# Patient Record
Sex: Female | Born: 1995 | Hispanic: Yes | Marital: Married | Smoking: Never smoker
Health system: Southern US, Community
[De-identification: ages and names within clinical notes are randomized; demographics above are authoritative.]

## PROBLEM LIST (undated history)

## (undated) DIAGNOSIS — Z789 Other specified health status: Secondary | ICD-10-CM

---
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
## 2017-09-21 NOTE — L&D Delivery Note (Addendum)
Delivery Note At 2:36 PM a viable and healthy female was delivered via VBAC, Spontaneous (Presentation: cepahlic; LOA  ).  APGAR: 8, 10;   Placenta status: intact, spontaneous .  Cord: 3 vessel with no complications  Anesthesia:  epidural Episiotomy: None Lacerations:  First degree labial tear Suture Repair: 3.0 vicryl on sh Est. Blood Loss (mL):  150mL  Mom to postpartum.  Baby to Couplet care / Skin to Skin.  Myrene BuddyJacob Christifer Chapdelaine 12/31/2017, 3:04 PM

## 2017-12-20 ENCOUNTER — Encounter: Payer: Self-pay | Admitting: *Deleted

## 2017-12-20 ENCOUNTER — Other Ambulatory Visit: Payer: Self-pay

## 2017-12-20 ENCOUNTER — Encounter (HOSPITAL_COMMUNITY): Payer: Self-pay | Admitting: *Deleted

## 2017-12-20 ENCOUNTER — Inpatient Hospital Stay (HOSPITAL_COMMUNITY)
Admission: AD | Admit: 2017-12-20 | Discharge: 2017-12-20 | Disposition: A | Discharge status: Home / Self Care | Payer: Medicaid Other | Source: Ambulatory Visit | Attending: Obstetrics and Gynecology | Admitting: Obstetrics and Gynecology

## 2017-12-20 DIAGNOSIS — O26893 Other specified pregnancy related conditions, third trimester: Secondary | ICD-10-CM | POA: Diagnosis not present

## 2017-12-20 DIAGNOSIS — R102 Pelvic and perineal pain: Secondary | ICD-10-CM

## 2017-12-20 DIAGNOSIS — Z3A37 37 weeks gestation of pregnancy: Secondary | ICD-10-CM | POA: Insufficient documentation

## 2017-12-20 DIAGNOSIS — O34219 Maternal care for unspecified type scar from previous cesarean delivery: Secondary | ICD-10-CM | POA: Diagnosis present

## 2017-12-20 HISTORY — DX: Other specified health status: Z78.9

## 2017-12-20 LAB — DIFFERENTIAL
BASOS ABS: 0 10*3/uL (ref 0.0–0.1)
Basophils Relative: 0 %
EOS ABS: 0.1 10*3/uL (ref 0.0–0.7)
EOS PCT: 1 %
LYMPHS ABS: 1.6 10*3/uL (ref 0.7–4.0)
Lymphocytes Relative: 25 %
MONOS PCT: 4 %
Monocytes Absolute: 0.3 10*3/uL (ref 0.1–1.0)
Neutro Abs: 4.6 10*3/uL (ref 1.7–7.7)
Neutrophils Relative %: 70 %

## 2017-12-20 LAB — CBC
HEMATOCRIT: 29.5 % — AB (ref 36.0–46.0)
HEMOGLOBIN: 9.7 g/dL — AB (ref 12.0–15.0)
MCH: 28.4 pg (ref 26.0–34.0)
MCHC: 32.9 g/dL (ref 30.0–36.0)
MCV: 86.3 fL (ref 78.0–100.0)
Platelets: 168 10*3/uL (ref 150–400)
RBC: 3.42 MIL/uL — ABNORMAL LOW (ref 3.87–5.11)
RDW: 13.6 % (ref 11.5–15.5)
WBC: 6.6 10*3/uL (ref 4.0–10.5)

## 2017-12-20 LAB — URINALYSIS, ROUTINE W REFLEX MICROSCOPIC
BILIRUBIN URINE: NEGATIVE
GLUCOSE, UA: NEGATIVE mg/dL
HGB URINE DIPSTICK: NEGATIVE
KETONES UR: NEGATIVE mg/dL
LEUKOCYTES UA: NEGATIVE
Nitrite: NEGATIVE
PROTEIN: NEGATIVE mg/dL
Specific Gravity, Urine: 1.018 (ref 1.005–1.030)
pH: 6 (ref 5.0–8.0)

## 2017-12-20 LAB — TYPE AND SCREEN
ABO/RH(D): O POS
Antibody Screen: NEGATIVE

## 2017-12-20 LAB — OB RESULTS CONSOLE GBS: GBS: NEGATIVE

## 2017-12-20 LAB — WET PREP, GENITAL
Clue Cells Wet Prep HPF POC: NONE SEEN
Sperm: NONE SEEN
Trich, Wet Prep: NONE SEEN
Yeast Wet Prep HPF POC: NONE SEEN

## 2017-12-20 LAB — HEMOGLOBIN A1C
Hgb A1c MFr Bld: 5.1 % (ref 4.8–5.6)
MEAN PLASMA GLUCOSE: 99.67 mg/dL

## 2017-12-20 LAB — HEPATITIS B SURFACE ANTIGEN: Hepatitis B Surface Ag: NEGATIVE

## 2017-12-20 LAB — ABO/RH: ABO/RH(D): O POS

## 2017-12-20 NOTE — MAU Provider Note (Signed)
Chief Complaint:  Back Pain and Pelvic Pain   First Provider Initiated Contact with Patient 12/20/17 1352      HPI: Joanne Hooper is a 22 y.o. G2P1001 at [redacted]w[redacted]d who presents to maternity admissions reporting some intermittent mild pelvic pain today. The pain is intermittent, mild, and unchanged since onset.  She has not tried any treatments. The pelvic pain is associated with a slight increase in thin vaginal discharge without odor. She does not have prenatal care in the [place] yet but wants to establish care. She started care in [place] and has [place] but no other records. She has hx of C/S x 1, emergency C/S for fetal distress at 8 cm dilation. She has vertical skin incision but is unsure of the incision on her uterus.  She desires TOLAC with this pregnancy. Her pregnancy has been uncomplicated. In her prior pregnancy, she had skin lesions on the perineum that her regular doctor diagnosed as skin tags but an ED physician was concerned that they were HPV. She reports they were raised skin colored smooth round lesions that got larger during the pregnancy. They were not painful to touch but sometimes hurt when walking and rubbing together. All STD testing was negative. She still has marks from those previous lesions but they did increase in size during this pregnancy. There are no other associated symptoms. She reports good fetal movement, denies LOF, vaginal bleeding, vaginal itching/burning, urinary symptoms, h/a, dizziness, n/v, or fever/chills.    HPI  Past Medical History: Past Medical History:  Diagnosis Date  . Medical history non-contributory     Past obstetric history: OB History  Gravida Para Term Preterm AB Living  2 1 1     1   SAB TAB Ectopic Multiple Live Births               # Outcome Date GA Lbr Len/2nd Weight Sex Delivery Anes PTL Lv  2 Current           1 Term             Past Surgical History: Past Surgical History:  Procedure Laterality Date  . CESAREAN SECTION       Family History: No family history on file.  Social History: Social History   Tobacco Use  . Smoking status: Never Smoker  . Smokeless tobacco: Never Used  Substance Use Topics  . Alcohol use: Never    Frequency: Never  . Drug use: Never    Allergies:  Allergies  Allergen Reactions  . Tuna [Fish Allergy] Swelling    Swollen skin    Meds:  No medications prior to admission.    ROS:  Review of Systems  Constitutional: Negative for chills, fatigue and fever.  Eyes: Negative for visual disturbance.  Respiratory: Negative for shortness of breath.   Cardiovascular: Negative for chest pain.  Gastrointestinal: Negative for abdominal pain, nausea and vomiting.  Genitourinary: Positive for pelvic pain. Negative for difficulty urinating, dysuria, flank pain, vaginal bleeding, vaginal discharge and vaginal pain.  Neurological: Negative for dizziness and headaches.  Psychiatric/Behavioral: Negative.      I have reviewed patient's Past Medical Hx, Surgical Hx, Family Hx, Social Hx, medications and allergies.   Physical Exam   Patient Vitals for the past 24 hrs:  BP Temp Temp src Pulse Resp SpO2 Weight  12/20/17 1513 118/60 - - 88 18 - -  12/20/17 1153 114/68 98 F (36.7 C) Oral 88 17 100 % 172 lb 4 oz (78.1 kg)  Constitutional: Well-developed, well-nourished female in no acute distress.  Cardiovascular: normal rate Respiratory: normal effort GI: Abd soft, non-tender, gravid appropriate for gestational age.  MS: Extremities nontender, no edema, normal ROM Neurologic: Alert and oriented x 4.  GU: Neg CVAT.  PELVIC EXAM: Wet prep, GC, and GBS collected today.  On visual inspection, 5-6 small loose areas of skin, flesh colored, nonpainful to palpation in inner thigh/groin/vulvar areas.    Dilation: Closed Effacement (%): Thick Cervical Position: Posterior Station: -3 Exam by:: Dorrene German RN  FHT:  Baseline 145 , moderate variability, accelerations present, no  decelerations Contractions: rare, mild to palpation   Labs: Results for orders placed or performed during the hospital encounter of 12/20/17 (from the past 24 hour(s))  Urinalysis, Routine w reflex microscopic     Status: Abnormal   Collection Time: 12/20/17 11:36 AM  Result Value Ref Range   Color, Urine YELLOW YELLOW   APPearance HAZY (A) CLEAR   Specific Gravity, Urine 1.018 1.005 - 1.030   pH 6.0 5.0 - 8.0   Glucose, UA NEGATIVE NEGATIVE mg/dL   Hgb urine dipstick NEGATIVE NEGATIVE   Bilirubin Urine NEGATIVE NEGATIVE   Ketones, ur NEGATIVE NEGATIVE mg/dL   Protein, ur NEGATIVE NEGATIVE mg/dL   Nitrite NEGATIVE NEGATIVE   Leukocytes, UA NEGATIVE NEGATIVE  CBC     Status: Abnormal   Collection Time: 12/20/17  2:01 PM  Result Value Ref Range   WBC 6.6 4.0 - 10.5 K/uL   RBC 3.42 (L) 3.87 - 5.11 MIL/uL   Hemoglobin 9.7 (L) 12.0 - 15.0 g/dL   HCT 16.1 (L) 09.6 - 04.5 %   MCV 86.3 78.0 - 100.0 fL   MCH 28.4 26.0 - 34.0 pg   MCHC 32.9 30.0 - 36.0 g/dL   RDW 40.9 81.1 - 91.4 %   Platelets 168 150 - 400 K/uL  Differential     Status: None   Collection Time: 12/20/17  2:01 PM  Result Value Ref Range   Neutrophils Relative % 70 %   Neutro Abs 4.6 1.7 - 7.7 K/uL   Lymphocytes Relative 25 %   Lymphs Abs 1.6 0.7 - 4.0 K/uL   Monocytes Relative 4 %   Monocytes Absolute 0.3 0.1 - 1.0 K/uL   Eosinophils Relative 1 %   Eosinophils Absolute 0.1 0.0 - 0.7 K/uL   Basophils Relative 0 %   Basophils Absolute 0.0 0.0 - 0.1 K/uL  Type and screen     Status: None   Collection Time: 12/20/17  2:01 PM  Result Value Ref Range   ABO/RH(D) O POS    Antibody Screen NEG    Sample Expiration      12/23/2017 Performed at [facility], [address], [place], [place] [address]   Hemoglobin A1c     Status: None   Collection Time: 12/20/17  2:01 PM  Result Value Ref Range   Hgb A1c MFr Bld 5.1 4.8 - 5.6 %   Mean Plasma Glucose 99.67 mg/dL  Wet prep, genital     Status: Abnormal    Collection Time: 12/20/17  2:29 PM  Result Value Ref Range   Yeast Wet Prep HPF POC NONE SEEN NONE SEEN   Trich, Wet Prep NONE SEEN NONE SEEN   Clue Cells Wet Prep HPF POC NONE SEEN NONE SEEN   WBC, Wet Prep HPF POC FEW (A) NONE SEEN   Sperm NONE SEEN    --/--/O POS (04/01 1401)  Imaging:  No results found.  MAU Course/MDM: OB panel, A1C, wet prep, GBS, GCC collected NST reviewed and reactive [facility] Consult Dr Debroah LoopArnold with presentation, exam findings and test results, specifically with previous surgical scar Likely low transverse uterine incision but pt will try to obtain records from [place] VBAC consent signed today, taken to office by hand to scan into chart Rest/heat/warm bath/Tylenol for pelvic pain Pt discharge with strict labor precautions.   Assessment: 1. Pelvic pain affecting pregnancy in third trimester, antepartum   2. Previous cesarean delivery affecting pregnancy, antepartum     Plan: Discharge home Labor precautions and fetal kick counts  Follow-up Information    [facility]   Specialty:  Obstetrics and Gynecology Why:  The office will call you with an appointment. Return to MAU with signs of labor or emergencies. Contact information: [address] [place] [id number] [phone]         Allergies as of 12/20/2017      Reactions   [place] [fish Allergy] Swelling   Swollen skin      Medication List    You have not been prescribed any medications.     Sharen CounterLisa Leftwich-Kirby Certified Nurse-Midwife 12/20/2017 6:47 PM

## 2017-12-20 NOTE — MAU Note (Signed)
2nd preg, due 4/20.  Yesterday, was having pain in low back and pelvis, increases with movement.  Started cramping last night.

## 2017-12-20 NOTE — Progress Notes (Signed)
Dr. Cyndi LennertHinds informed of pt's arrival, recently arrived from [place] on 3/20, C/O back & pelvic pain today, reactive FHR.  Received PNC in [place] has most recent U/S report with her.  First delivery was [facility]S, [facility]  MD states she will call me back.

## 2017-12-20 NOTE — Discharge Instructions (Signed)

## 2017-12-20 NOTE — MAU Note (Signed)
Pt had C/S with first baby, is interested in TOLAC.

## 2017-12-21 LAB — HIV ANTIBODY (ROUTINE TESTING W REFLEX): HIV SCREEN 4TH GENERATION: NONREACTIVE

## 2017-12-21 LAB — GC/CHLAMYDIA PROBE AMP (~~LOC~~) NOT AT ARMC
Chlamydia: NEGATIVE
NEISSERIA GONORRHEA: NEGATIVE

## 2017-12-21 LAB — RUBELLA SCREEN

## 2017-12-21 LAB — RPR: RPR: NONREACTIVE

## 2017-12-22 LAB — CULTURE, BETA STREP (GROUP B ONLY)

## 2017-12-27 ENCOUNTER — Telehealth: Payer: Self-pay | Admitting: General Practice

## 2017-12-27 NOTE — Telephone Encounter (Signed)
Patient called and left message on nurse line stating she was seen a couple days ago and was told to call us back with certain information. Called patient and she states she is [redacted] weeks pregnant and just moved here from [place]. Patient states she had a previous c-section but wants a VBAC this time. Patient states she was told to get her c-section report so we have that information but she has had a hard time getting that. Patient states she just came by the office and signed a release so we can attempt to get records. Patient is aware of appt tomorrow. Patient had no questions

## 2017-12-28 ENCOUNTER — Ambulatory Visit (INDEPENDENT_AMBULATORY_CARE_PROVIDER_SITE_OTHER): Payer: Medicaid Other | Admitting: Obstetrics and Gynecology

## 2017-12-28 ENCOUNTER — Telehealth: Payer: Self-pay

## 2017-12-28 ENCOUNTER — Encounter: Payer: Self-pay | Admitting: Obstetrics and Gynecology

## 2017-12-28 DIAGNOSIS — Z3483 Encounter for supervision of other normal pregnancy, third trimester: Secondary | ICD-10-CM | POA: Diagnosis not present

## 2017-12-28 DIAGNOSIS — Z23 Encounter for immunization: Secondary | ICD-10-CM | POA: Diagnosis not present

## 2017-12-28 DIAGNOSIS — O34219 Maternal care for unspecified type scar from previous cesarean delivery: Secondary | ICD-10-CM | POA: Diagnosis not present

## 2017-12-28 DIAGNOSIS — Z3480 Encounter for supervision of other normal pregnancy, unspecified trimester: Secondary | ICD-10-CM | POA: Insufficient documentation

## 2017-12-28 NOTE — Progress Notes (Signed)
   PRENATAL VISIT NOTE  Subjective:  Joanne Hooper is a 22 y.o. G2P1001 at 7136w3d being seen today for ongoing prenatal care.  Patient recently moved from [place] without records. She is alone in the country currently without family. She is currently monitored for the following issues for this low-risk pregnancy and has Previous cesarean delivery affecting pregnancy, antepartum and Supervision of other normal pregnancy, antepartum on their problem list.  Patient reports no complaints.  Contractions: Irregular. Vag. Bleeding: None.   . Denies leaking of fluid.   The following portions of the patient's history were reviewed and updated as appropriate: allergies, current medications, past family history, past medical history, past social history, past surgical history and problem list. Problem list updated.  Objective:   Vitals:   12/28/17 1000 12/28/17 1004  BP: 128/72   Pulse: 75   Weight: 173 lb 11.2 oz (78.8 kg)   Height:  5\' 1"  (1.549 m)    Fetal Status: Fetal Heart Rate (bpm): 130 Fundal Height: 39 cm    Presentation: Vertex  General:  Alert, oriented and cooperative. Patient is in no acute distress.  Skin: Skin is warm and dry. No rash noted.   Cardiovascular: Normal heart rate noted  Respiratory: Normal respiratory effort, no problems with respiration noted  Abdomen: Soft, gravid, appropriate for gestational age.  Pain/Pressure: Present     Pelvic: Cervical exam deferred Dilation: 1.5 Effacement (%): 50 Station: -3  Extremities: Normal range of motion.  Edema: Trace  Mental Status: Normal mood and affect. Normal behavior. Normal judgment and thought content.   Assessment and Plan:  Pregnancy: G2P1001 at 5336w3d  1. Supervision of other normal pregnancy, antepartum Patient is doing well without complaints Ultrasound ordered with health department  2. Previous cesarean delivery affecting pregnancy, antepartum Patient desires TOLAC Consent signed 12/20/2017  Term labor  symptoms and general obstetric precautions including but not limited to vaginal bleeding, contractions, leaking of fluid and fetal movement were reviewed in detail with the patient. Please refer to After Visit Summary for other counseling recommendations.  Return in about 1 week (around 01/04/2018) for ROB.  No future appointments.  Catalina AntiguaPeggy Lon Klippel, MD

## 2017-12-28 NOTE — Telephone Encounter (Signed)
Called pt to inform her of her US scheduled at the [facility] on April 11,2019 at 9:30am. Pt verbalized understanding and had no questions.  

## 2017-12-28 NOTE — Addendum Note (Signed)
Addended by: Lorelle GibbsWILSON, CHIQUITA L on: 12/28/2017 11:37 AM   Modules accepted: Orders

## 2017-12-31 ENCOUNTER — Other Ambulatory Visit: Payer: Self-pay

## 2017-12-31 ENCOUNTER — Inpatient Hospital Stay (HOSPITAL_COMMUNITY): Payer: Medicaid Other | Admitting: Anesthesiology

## 2017-12-31 ENCOUNTER — Encounter (HOSPITAL_COMMUNITY): Payer: Self-pay

## 2017-12-31 ENCOUNTER — Inpatient Hospital Stay (HOSPITAL_COMMUNITY)
Admission: AD | Admit: 2017-12-31 | Discharge: 2018-01-02 | DRG: 807 | Disposition: A | Discharge status: Home / Self Care | Payer: Medicaid Other | Source: Ambulatory Visit | Attending: Obstetrics & Gynecology | Admitting: Obstetrics & Gynecology

## 2017-12-31 DIAGNOSIS — Z3A39 39 weeks gestation of pregnancy: Secondary | ICD-10-CM | POA: Diagnosis not present

## 2017-12-31 DIAGNOSIS — Z3480 Encounter for supervision of other normal pregnancy, unspecified trimester: Secondary | ICD-10-CM

## 2017-12-31 DIAGNOSIS — O34219 Maternal care for unspecified type scar from previous cesarean delivery: Principal | ICD-10-CM | POA: Diagnosis present

## 2017-12-31 DIAGNOSIS — Z3483 Encounter for supervision of other normal pregnancy, third trimester: Secondary | ICD-10-CM | POA: Diagnosis present

## 2017-12-31 DIAGNOSIS — Z3A38 38 weeks gestation of pregnancy: Secondary | ICD-10-CM

## 2017-12-31 LAB — CBC
HCT: 31.2 % — ABNORMAL LOW (ref 36.0–46.0)
Hemoglobin: 10.1 g/dL — ABNORMAL LOW (ref 12.0–15.0)
MCH: 27.4 pg (ref 26.0–34.0)
MCHC: 32.4 g/dL (ref 30.0–36.0)
MCV: 84.6 fL (ref 78.0–100.0)
Platelets: 192 K/uL (ref 150–400)
RBC: 3.69 MIL/uL — ABNORMAL LOW (ref 3.87–5.11)
RDW: 13.8 % (ref 11.5–15.5)
WBC: 10.6 K/uL — ABNORMAL HIGH (ref 4.0–10.5)

## 2017-12-31 LAB — TYPE AND SCREEN
ABO/RH(D): O POS
Antibody Screen: NEGATIVE

## 2017-12-31 LAB — SYPHILIS: RPR W/REFLEX TO RPR TITER AND TREPONEMAL ANTIBODIES, TRADITIONAL SCREENING AND DIAGNOSIS ALGORITHM: RPR Ser Ql: NONREACTIVE

## 2017-12-31 MED ORDER — WITCH HAZEL-GLYCERIN EX PADS: 1.0000 "application " | MEDICATED_PAD | CUTANEOUS | Status: DC | PRN

## 2017-12-31 MED ORDER — FLEET ENEMA 7-19 GM/118ML RE ENEM: 1.0000 | ENEMA | RECTAL | Status: DC | PRN

## 2017-12-31 MED ORDER — OXYCODONE-ACETAMINOPHEN 5-325 MG PO TABS
1.0000 | ORAL_TABLET | ORAL | Status: DC | PRN
Start: 2017-12-31 — End: 2017-12-31

## 2017-12-31 MED ORDER — SIMETHICONE 80 MG PO CHEW
80.0000 mg | CHEWABLE_TABLET | ORAL | Status: DC | PRN
  Administered 2018-01-01 (×2): 80 mg via ORAL
  Filled 2017-12-31 (×2): qty 1

## 2017-12-31 MED ORDER — PRENATAL MULTIVITAMIN CH
1.0000 | ORAL_TABLET | Freq: Every day | ORAL | Status: DC
  Administered 2018-01-01 – 2018-01-02 (×2): 1 via ORAL
  Filled 2017-12-31 (×2): qty 1

## 2017-12-31 MED ORDER — TERBUTALINE SULFATE 1 MG/ML IJ SOLN
0.2500 mg | Freq: Once | INTRAMUSCULAR | Status: DC | PRN
  Filled 2017-12-31: qty 1

## 2017-12-31 MED ORDER — EPHEDRINE 5 MG/ML INJ
10.0000 mg | INTRAVENOUS | Status: DC | PRN
  Filled 2017-12-31: qty 2

## 2017-12-31 MED ORDER — LACTATED RINGERS IV SOLN: 500.0000 mL | Freq: Once | INTRAVENOUS | Status: DC

## 2017-12-31 MED ORDER — OXYTOCIN 40 UNITS IN LACTATED RINGERS INFUSION - SIMPLE MED
2.5000 [IU]/h | INTRAVENOUS | Status: DC
  Filled 2017-12-31: qty 1000

## 2017-12-31 MED ORDER — SENNOSIDES-DOCUSATE SODIUM 8.6-50 MG PO TABS
2.0000 | ORAL_TABLET | ORAL | Status: DC
  Administered 2018-01-01 (×2): 2 via ORAL
  Filled 2017-12-31 (×2): qty 2

## 2017-12-31 MED ORDER — DIBUCAINE 1 % RE OINT: 1.0000 "application " | TOPICAL_OINTMENT | RECTAL | Status: DC | PRN

## 2017-12-31 MED ORDER — OXYCODONE-ACETAMINOPHEN 5-325 MG PO TABS: 2.0000 | ORAL_TABLET | ORAL | Status: DC | PRN

## 2017-12-31 MED ORDER — IBUPROFEN 600 MG PO TABS
600.0000 mg | ORAL_TABLET | Freq: Four times a day (QID) | ORAL | Status: DC
  Administered 2017-12-31 – 2018-01-02 (×7): 600 mg via ORAL
  Filled 2017-12-31 (×8): qty 1

## 2017-12-31 MED ORDER — MENTHOL 3 MG MT LOZG
1.0000 | LOZENGE | OROMUCOSAL | Status: DC | PRN
  Filled 2017-12-31: qty 9

## 2017-12-31 MED ORDER — BUTORPHANOL TARTRATE 1 MG/ML IJ SOLN
1.0000 mg | Freq: Once | INTRAMUSCULAR | Status: DC
Start: 2017-12-31 — End: 2017-12-31

## 2017-12-31 MED ORDER — LACTATED RINGERS IV SOLN
INTRAVENOUS | Status: DC
  Administered 2017-12-31 (×2): via INTRAVENOUS

## 2017-12-31 MED ORDER — LACTATED RINGERS IV SOLN: 500.0000 mL | INTRAVENOUS | Status: DC | PRN

## 2017-12-31 MED ORDER — PHENYLEPHRINE 40 MCG/ML (10ML) SYRINGE FOR IV PUSH (FOR BLOOD PRESSURE SUPPORT)
PREFILLED_SYRINGE | INTRAVENOUS | Status: AC
  Filled 2017-12-31: qty 10

## 2017-12-31 MED ORDER — SOD CITRATE-CITRIC ACID 500-334 MG/5ML PO SOLN: 30.0000 mL | ORAL | Status: DC | PRN

## 2017-12-31 MED ORDER — ACETAMINOPHEN 325 MG PO TABS: 650.0000 mg | ORAL_TABLET | ORAL | Status: DC | PRN

## 2017-12-31 MED ORDER — ACETAMINOPHEN 325 MG PO TABS
650.0000 mg | ORAL_TABLET | ORAL | Status: DC | PRN
  Administered 2017-12-31 – 2018-01-02 (×7): 650 mg via ORAL
  Filled 2017-12-31 (×7): qty 2

## 2017-12-31 MED ORDER — DIPHENHYDRAMINE HCL 25 MG PO CAPS: 25.0000 mg | ORAL_CAPSULE | Freq: Four times a day (QID) | ORAL | Status: DC | PRN

## 2017-12-31 MED ORDER — FENTANYL 2.5 MCG/ML BUPIVACAINE 1/10 % EPIDURAL INFUSION (WH - ANES)
14.0000 mL/h | INTRAMUSCULAR | Status: DC | PRN
  Administered 2017-12-31: 14 mL/h via EPIDURAL

## 2017-12-31 MED ORDER — OXYTOCIN 40 UNITS IN LACTATED RINGERS INFUSION - SIMPLE MED
1.0000 m[IU]/min | INTRAVENOUS | Status: DC
  Administered 2017-12-31: 2 m[IU]/min via INTRAVENOUS

## 2017-12-31 MED ORDER — PHENYLEPHRINE 40 MCG/ML (10ML) SYRINGE FOR IV PUSH (FOR BLOOD PRESSURE SUPPORT)
80.0000 ug | PREFILLED_SYRINGE | INTRAVENOUS | Status: DC | PRN
  Filled 2017-12-31: qty 5

## 2017-12-31 MED ORDER — ONDANSETRON HCL 4 MG PO TABS: 4.0000 mg | ORAL_TABLET | ORAL | Status: DC | PRN

## 2017-12-31 MED ORDER — ONDANSETRON HCL 4 MG/2ML IJ SOLN: 4.0000 mg | INTRAMUSCULAR | Status: DC | PRN

## 2017-12-31 MED ORDER — DIPHENHYDRAMINE HCL 50 MG/ML IJ SOLN: 12.5000 mg | INTRAMUSCULAR | Status: DC | PRN

## 2017-12-31 MED ORDER — COCONUT OIL OIL: 1.0000 "application " | TOPICAL_OIL | Status: DC | PRN

## 2017-12-31 MED ORDER — FENTANYL 2.5 MCG/ML BUPIVACAINE 1/10 % EPIDURAL INFUSION (WH - ANES)
INTRAMUSCULAR | Status: AC
  Filled 2017-12-31: qty 100

## 2017-12-31 MED ORDER — LIDOCAINE HCL (PF) 1 % IJ SOLN
INTRAMUSCULAR | Status: DC | PRN
  Administered 2017-12-31: 13 mL via EPIDURAL

## 2017-12-31 MED ORDER — LIDOCAINE HCL (PF) 1 % IJ SOLN
30.0000 mL | INTRAMUSCULAR | Status: AC | PRN
  Administered 2017-12-31: 30 mL via SUBCUTANEOUS
  Filled 2017-12-31: qty 30

## 2017-12-31 MED ORDER — ZOLPIDEM TARTRATE 5 MG PO TABS: 5.0000 mg | ORAL_TABLET | Freq: Every evening | ORAL | Status: DC | PRN

## 2017-12-31 MED ORDER — BENZOCAINE-MENTHOL 20-0.5 % EX AERO
1.0000 "application " | INHALATION_SPRAY | CUTANEOUS | Status: DC | PRN
  Administered 2017-12-31 – 2018-01-02 (×2): 1 via TOPICAL
  Filled 2017-12-31 (×3): qty 56

## 2017-12-31 MED ORDER — MEASLES, MUMPS & RUBELLA VAC ~~LOC~~ INJ
0.5000 mL | INJECTION | Freq: Once | SUBCUTANEOUS | Status: AC
  Administered 2018-01-02: 0.5 mL via SUBCUTANEOUS
  Filled 2017-12-31: qty 0.5

## 2017-12-31 MED ORDER — FENTANYL CITRATE (PF) 100 MCG/2ML IJ SOLN: 50.0000 ug | INTRAMUSCULAR | Status: DC | PRN

## 2017-12-31 MED ORDER — TETANUS-DIPHTH-ACELL PERTUSSIS 5-2.5-18.5 LF-MCG/0.5 IM SUSP: 0.5000 mL | Freq: Once | INTRAMUSCULAR | Status: DC

## 2017-12-31 MED ORDER — OXYTOCIN BOLUS FROM INFUSION
500.0000 mL | Freq: Once | INTRAVENOUS | Status: AC
  Administered 2017-12-31: 500 mL via INTRAVENOUS

## 2017-12-31 MED ORDER — ONDANSETRON HCL 4 MG/2ML IJ SOLN: 4.0000 mg | Freq: Four times a day (QID) | INTRAMUSCULAR | Status: DC | PRN

## 2017-12-31 NOTE — MAU Note (Signed)
cntx since 2300 last night. Denies LOF or bleeding. +FM

## 2017-12-31 NOTE — Anesthesia Pain Management Evaluation Note (Signed)
  CRNA Pain Management Visit Note  Patient: Joanne Hooper, 22 y.o., female  "Hello I am a member of the anesthesia team at [facility]. We have an anesthesia team available at all times to provide care throughout the hospital, including epidural management and anesthesia for C-section. I don't know your plan for the delivery whether it a natural birth, water birth, IV sedation, nitrous supplementation, doula or epidural, but we want to meet your pain goals."   1.Was your pain managed to your expectations on prior hospitalizations?   Yes   2.What is your expectation for pain management during this hospitalization?     Epidural  3.How can we help you reach that goal? Epidural  Record the patient's initial score and the patient's pain goal.   Pain: 10 - pressure-"feel like needs to go to the bathroom" Patient is 9 cm.  Pain Goal: unable to assess. [facility] wants you to be able to say your pain was always managed very well.  Wilmont Olund 12/31/2017

## 2017-12-31 NOTE — Progress Notes (Signed)
Subjective: Doing well. Pain controlled. Had ROM prior to entering room.   Objective: BP 114/76   Pulse 100   Temp 98.3 F (36.8 C) (Oral)   Resp 18   Ht 5\' 1"  (1.549 m)   Wt 79.9 kg (176 lb 1.3 oz)   SpO2 99%   BMI 33.27 kg/m  No intake/output data recorded. No intake/output data recorded.  FHT:  FHR: 140 bpm, variability: minimal ,  accelerations:  Abscent,  decelerations:  Present late decel x1 UC:   regular, every 4 minutes SVE:   Dilation: 7 Effacement (%): 90 Station: -1 Exam by:: k fields, rn  Labs: Lab Results  Component Value Date   WBC 10.6 (H) 12/31/2017   HGB 10.1 (L) 12/31/2017   HCT 31.2 (L) 12/31/2017   MCV 84.6 12/31/2017   PLT 192 12/31/2017    Assessment / Plan: TOLAC progressing well. Just had ROM, so will hold off on augmentation for now.  Labor: Progressing normally Preeclampsia:  N/A Fetal Wellbeing:  Category II Pain Control:  Epidural I/D:  n/a Anticipated MOD:  NSVD  Myrene BuddyJacob Ciani Rutten 12/31/2017, 12:24 PM

## 2017-12-31 NOTE — Anesthesia Postprocedure Evaluation (Signed)
Anesthesia Post Note  Patient: Joanne Hooper  Procedure(s) Performed: AN AD HOC LABOR EPIDURAL     Patient location during evaluation: Mother Baby Anesthesia Type: Epidural Level of consciousness: awake and alert and oriented Pain management: satisfactory to patient Vital Signs Assessment: post-procedure vital signs reviewed and stable Respiratory status: respiratory function stable Cardiovascular status: stable Postop Assessment: no headache, no backache, epidural receding, patient able to bend at knees, no signs of nausea or vomiting and adequate PO intake Anesthetic complications: no    Last Vitals:  Vitals:   12/31/17 1531 12/31/17 1546  BP: 119/60 112/62  Pulse: 93 88  Resp: 18 18  Temp:    SpO2:      Last Pain:  Vitals:   12/31/17 1727  TempSrc:   PainSc: 8    Pain Goal:                 Joanne Hooper

## 2017-12-31 NOTE — Anesthesia Procedure Notes (Signed)
Epidural Patient location during procedure: OB Start time: 12/31/2017 8:28 AM End time: 12/31/2017 8:48 AM  Staffing Anesthesiologist: Lowella CurbMiller, Vayden Weinand Ray, MD Performed: anesthesiologist   Preanesthetic Checklist Completed: patient identified, site marked, surgical consent, pre-op evaluation, timeout performed, IV checked, risks and benefits discussed and monitors and equipment checked  Epidural Patient position: sitting Prep: ChloraPrep Patient monitoring: heart rate, cardiac monitor, continuous pulse ox and blood pressure Approach: midline Location: L1-L2 Injection technique: LOR saline  Needle:  Needle type: Tuohy  Needle gauge: 17 G Needle length: 9 cm Needle insertion depth: 6 cm Catheter type: closed end flexible Catheter size: 20 Guage Catheter at skin depth: 10 cm Test dose: negative  Assessment Events: blood not aspirated, injection not painful, no injection resistance, negative IV test and no paresthesia  Additional Notes Reason for block:procedure for pain

## 2017-12-31 NOTE — H&P (Addendum)
OBSTETRIC ADMISSION HISTORY AND PHYSICAL  Joanne Hooper is a 22 y.o. female G2P1001 with IUP at 2w6dby lmp presenting for active labor. She reports +FMs, No LOF, no VB, no blurry vision, headaches or peripheral edema, and RUQ pain. [place] her report there has been nothing concerning with her pregnancy. Her blood pressures have been fine, and her blood glucose was well controlled. She had an ultrasound performed on 4/11 which was normal.  [place] She states that they told her the reason was for fetal distress. While she has a vertical skin incision the uterine incision is unknown. She also said that there was a lot of concern about skin tags that she had. She had gotten to 8 dilation and that is when they decided to do the C-Section. She has never been told that she cannot labor again. VBAC consent signed on 4/1- on chart.  She plans on breast feeding. She request IUD for birth control. [facility]   Nursing Staff Provider  Office Location   Dating    Language  English  Anatomy [place]   Flu Vaccine  12/28/17 Genetic Screen  NIPS:   AFP:   First Screen:  Quad:    TDaP vaccine  12/28/17 Hgb A1C or  GTT Early  Third trimester A1c 5.1  Rhogam     LAB RESULTS   Feeding Plan Breast  Blood Type --/--/O POS, OJenetta DownerPOS [facility] [address] [place] [address] [phone]/01 1401)   Contraception IUD  Antibody NEG (04/01 1401)  Circumcision Girl  Rubella <0.90 (04/01 1401)  Pediatrician   RPR Non Reactive (04/01 1401)   Support Person Friend  HBsAg Negative (04/01 1401)   Prenatal Classes  HIV Non Reactive (04/01 1401)  BTL Consent  GBS   negative(For PCN allergy, check sensitivities)   VBAC Consent  12/20/2017 Pap     Hgb Electro      CF     SMA     Waterbirth  [ ]  Class [ ]  Consent [ ]  CNM visit    Dating: By Lmp --->  Estimated Date of  Delivery: 01/08/18  Sono:    @[redacted]w[redacted]d , CWD, normal anatomy, cephalic presentation   Prenatal History/Complications:  Past Medical History: Past Medical History:  Diagnosis Date  . Medical history non-contributory     Past Surgical History: Past Surgical History:  Procedure Laterality Date  . CESAREAN SECTION      Obstetrical History: OB History    Gravida  2   Para  1   Term  1   Preterm      AB      Living  1     SAB      TAB      Ectopic      Multiple      Live Births  1           Social History: Social History   Socioeconomic History  . Marital status: Married    Spouse name: Not on file  . Number of children: Not on file  . Years of education: Not on file  . Highest education level: Not on file  Occupational History  . Not on file  Social Needs  . Financial resource strain: Not on file  . Food insecurity:    Worry: Not on file  Inability: Not on file  . Transportation needs:    Medical: Not on file    Non-medical: Not on file  Tobacco Use  . Smoking status: Never Smoker  . Smokeless tobacco: Never Used  Substance and Sexual Activity  . Alcohol use: Never    Frequency: Never  . Drug use: Never  . Sexual activity: Not Currently    Birth control/protection: None  Lifestyle  . Physical activity:    Days per week: Not on file    Minutes per session: Not on file  . Stress: Not on file  Relationships  . Social connections:    Talks on phone: Not on file    Gets together: Not on file    Attends religious service: Not on file    Active member of club or organization: Not on file    Attends meetings of clubs or organizations: Not on file    Relationship status: Not on file  Other Topics Concern  . Not on file  Social History Narrative  . Not on file    Family History: Family History  Problem Relation Age of Onset  . Diabetes Mother     Allergies: Allergies  Allergen Reactions  . Tuna [Fish Allergy] Swelling     Swollen skin    No medications prior to admission.     Review of Systems   All systems reviewed and negative except as stated in HPI  Blood pressure 119/67, pulse 91, temperature 98.4 F (36.9 C), temperature source Oral, resp. rate 16, height 5' 1"  (1.549 m), weight 79.9 kg (176 lb 1.3 oz), SpO2 99 %. General appearance: alert and cooperative Lungs: clear to auscultation bilaterally Heart: regular rate and rhythm Abdomen: soft, non-tender; bowel sounds normal Extremities: Homans sign is negative, no sign of DVT DTR's normal Presentation: cephalic Fetal monitoringBaseline: 130 bpm, Variability: Good {> 6 bpm), Accelerations: Reactive and Decelerations: Absent Uterine activityFrequency: Every 4 minutes Dilation: 7 Effacement (%): 100 Station: -2 Exam by:: k fields, rn   Prenatal labs: ABO, Rh: --/--/O POS, O POS Performed at [facility], [address] [place], [place] [address]  [phone]/01 1401) Antibody: NEG (04/01 1401) Rubella: <0.90 (04/01 1401) RPR: Non Reactive (04/01 1401)  HBsAg: Negative (04/01 1401)  HIV: Non Reactive (04/01 1401)  GBS: Negative (04/01 0000)  1 hr Glucola normal per patient account Genetic screening  Normal per patient account Anatomy US normal per patient account  Prenatal Transfer Tool  Maternal Diabetes: No- nl A1c Genetic Screening: Declined Maternal Ultrasounds/Referrals: Normal Fetal Ultrasounds or other Referrals:  None Maternal Substance Abuse:  No Significant Maternal Medications:  None Significant Maternal Lab Results: Lab values include: Group B Strep negative  Results for orders placed or performed during the hospital encounter of 12/31/17 (from the past 24 hour(s))  CBC   Collection Time: 12/31/17  7:45 AM  Result Value Ref Range   WBC 10.6 (H) 4.0 - 10.5 K/uL   RBC 3.69 (L) 3.87 - 5.11 MIL/uL   Hemoglobin 10.1 (L) 12.0 - 15.0 g/dL   HCT 31.2 (L) 36.0 - 46.0 %   MCV 84.6 78.0 - 100.0 fL   MCH 27.4 26.0 - 34.0 pg    MCHC 32.4 30.0 - 36.0 g/dL   RDW 13.8 11.5 - 15.5 %   Platelets 192 150 - 400 K/uL    Patient Active Problem List   Diagnosis Date Noted  . Supervision of other normal pregnancy, antepartum 12/28/2017  . Previous cesarean delivery affecting pregnancy, antepartum  12/20/2017    Assessment/Plan:  Joanne Hooper is a 22 y.o. G2P1001 at 35w6dhere for onset of labor. Unfortunately [place] has vertical incision from previous C-Section but it is unknown which incision use on the uterus. To best of patient's knowledge she has not been told she cannot labor. She has a consent for a VBAC in chart. GBS negative. Has received epidural.  #Labor:expectant management, TOLAC, VBAC consent signed #Pain: epidrual #FWB: Cat 1 #ID:  N/A #MOF: breast #MOC:IUD #Circ:  N/A  JGuadalupe Dawn MD  12/31/2017, 9:05 AM   CNM attestation:  I have seen and examined this patient; I agree with above documentation in the resident's note.   Joanne Hooper a 22y.o. G2P1001 here for active labor; desires TOLAC- understands risks and consent is on chart  PE: BP 114/64   Pulse 92   Temp 98.4 F (36.9 C) (Oral)   Resp 18   Ht 5' 1"  (1.549 m)   Wt 79.9 kg (176 lb 1.3 oz)   SpO2 99%   BMI 33.27 kg/m  Gen: calm comfortable, NAD w/ epidural Resp: normal effort, no distress Abd: gravid  ROS, labs, PMH reviewed  [facility]: [facility] Anticipate VB[facility] Offer MMR pp  SSerita GrammesCNM 12/31/2017, 11:36 AM

## 2017-12-31 NOTE — Lactation Note (Signed)
This note was copied from a baby's chart. Lactation Consultation Note  Patient Name: Joanne Hooper WUJWJ'XToday's Date: 12/31/2017 Reason for consult: Initial assessment;Early term 5637-38.6wks  7 hours old early term female who is being exclusively BF by her mother, she's a P2. Mom is experienced BF she was able to BF her first child for 14 months. RN was working with mom when entering the room, per RN baby is too sleepy to feed, she explained to mom to "bring the food to baby if baby is not taking the food from the breast" she suggested to hand express if baby is unable to latch.  LC offered latch assistance but baby would not wake up at first. Mom hand expressed 3 ml of colostrum and LC showed mom how to spoon feed baby. Baby was able to finish all EBM and by the time she was done she was wide awake and ready to go to the breast. Latched baby on left breast on football position and she was able to sustain the latch [facility], baby still nursing when exiting the room.  Encouraged mom to feed STS baby 8-12 times/24 hours or sooner if feeding cues are present. Also, encouraged mom to hand express prior feedings and spoon feed baby if she's still too sleepy to feed. Mom will give baby an opportunity to feed placing her STS at the breast if baby has not cued in 3 hours. Reviewed BF brochure, BF resources and feeding diary, mom is aware of LC services and will call PRN.  Maternal Data Formula Feeding for Exclusion: No Has patient been taught Hand Expression?: Yes Does the patient have breastfeeding experience prior to this delivery?: Yes  Feeding Feeding Type: Breast Fed Length of feed: 8 min(Baby still nursing when exiting the room)  LATCH Score Latch: Grasps breast easily, tongue down, lips flanged, rhythmical sucking.  Audible Swallowing: A few with stimulation  Type of Nipple: Everted at rest and after stimulation  Comfort (Breast/Nipple): Soft /  non-tender  Hold (Positioning): Assistance needed to correctly position infant at breast and maintain latch.  LATCH Score: 8  Interventions Interventions: Breast feeding basics reviewed;Assisted with latch;Skin to skin;Breast massage;Breast compression;Adjust position;Hand express;Support pillows;Position options;Expressed milk  Lactation Tools Discussed/Used Tools: Other (comment)(spoon) WIC Program: No   Consult Status Consult Status: Follow-up Date: 01/01/18 Follow-up type: In-patient    Joanne Hooper 12/31/2017, 9:50 PM

## 2017-12-31 NOTE — Anesthesia Preprocedure Evaluation (Signed)
Anesthesia Evaluation  Patient identified by MRN, date of birth, ID band Patient awake    Reviewed: Allergy & Precautions, NPO status , Patient's Chart, lab work & pertinent test results  Airway Mallampati: II  TM Distance: >3 FB Neck ROM: Full    Dental no notable dental hx.    Pulmonary neg pulmonary ROS,    Pulmonary exam normal breath sounds clear to auscultation       Cardiovascular negative cardio ROS Normal cardiovascular exam Rhythm:Regular Rate:Normal     Neuro/Psych negative neurological ROS  negative psych ROS   GI/Hepatic negative GI ROS, Neg liver ROS,   Endo/Other  negative endocrine ROS  Renal/GU negative Renal ROS  negative genitourinary   Musculoskeletal negative musculoskeletal ROS (+)   Abdominal   Peds negative pediatric ROS (+)  Hematology negative hematology ROS (+)   Anesthesia Other Findings   Reproductive/Obstetrics negative OB ROS (+) Pregnancy                             Anesthesia Physical Anesthesia Plan  ASA: II  Anesthesia Plan: Epidural   Post-op Pain Management:    Induction:   PONV Risk Score and Plan: Treatment may vary due to age or medical condition  Airway Management Planned:   Additional Equipment:   Intra-op Plan:   Post-operative Plan:   Informed Consent: I have reviewed the patients History and Physical, chart, labs and discussed the procedure including the risks, benefits and alternatives for the proposed anesthesia with the patient or authorized representative who has indicated his/her understanding and acceptance.     Plan Discussed with: CRNA  Anesthesia Plan Comments:         Anesthesia Quick Evaluation

## 2017-12-31 NOTE — Progress Notes (Signed)
Called Dr Abelardo DieselMcMullen w/repeat SVE at 0700. Exam unchanged but pt very painful.  Requested pain meds and provider eval of patient.

## 2018-01-01 NOTE — Progress Notes (Signed)
MD notified of pts elevated temps and history of fever with last delivery. Encouraged Pt to drink plenty of fluids. Order received for sore throat lozengers

## 2018-01-01 NOTE — Progress Notes (Addendum)
POSTPARTUM PROGRESS NOTE  Post Partum Day #1 Subjective:  Joanne Hooper is a 22 y.o. G2P1001 2128w0d s/p VBAC after SOL.  No acute events overnight.  Pt denies problems with ambulating, voiding or po intake.  She denies nausea or vomiting.  Pain is well controlled. She states that Tylenol has been working well and is not feeling any cramping or pain. States that she is still experiencing vaginal bleed that is unchanged from yesterday; no blood clots noted.  Patient is planning on breastfeeding and getting an IUD at her postpartum appointment for postpartum contraception.   Objective: Blood pressure 104/71, pulse 79, temperature 98.1 F (36.7 C), temperature source Oral, resp. rate 18, height 5\' 1"  (1.549 m), weight 79.9 kg (176 lb 1.3 oz), SpO2 100 %.  Physical Exam:  General: alert, cooperative and no distress Lochia:normal flow Chest: no respiratory distress Heart:regular rate, distal pulses intact Abdomen: soft, nontender,  Uterine Fundus: firm, appropriately tender DVT Evaluation: No calf swelling or tenderness Extremities: no edema  Recent Labs    12/31/17 0745  HGB 10.1*  HCT 31.2*    Assessment/Plan:  ASSESSMENT: Joanne Hooper is a 22 y.o. G2P1001 6828w0d s/p VBAC after SOL. She states she is doing well but is still very fatigued.   Plan for discharge tomorrow, Breastfeeding and Contraception IUD   LOS: 1 day   [facility] Leory Allinson, Student-PA 01/01/2018, 7:17 AM

## 2018-01-02 ENCOUNTER — Encounter (HOSPITAL_COMMUNITY): Payer: Self-pay | Admitting: *Deleted

## 2018-01-02 MED ORDER — SENNOSIDES-DOCUSATE SODIUM 8.6-50 MG PO TABS: 2.0000 | ORAL_TABLET | Freq: Every evening | ORAL | 0 refills | Status: AC | PRN

## 2018-01-02 MED ORDER — IBUPROFEN 600 MG PO TABS: 600.0000 mg | ORAL_TABLET | Freq: Four times a day (QID) | ORAL | 0 refills | Status: AC

## 2018-01-02 NOTE — Discharge Summary (Signed)
OB Discharge Summary     Patient Name: Joanne Hooper DOB: 03/19/1996 MRN: [id number]  Date of admission: 12/31/2017 Delivering MD: Myrene BuddyFLETCHER, JACOB   Date of discharge: 01/02/2018  Admitting diagnosis: 39 WEEKS CTX Intrauterine pregnancy: 7492w1d     Secondary diagnosis:  Principal Problem:   NSVD (normal spontaneous vaginal delivery)  Additional problems:  Patient Active Problem List   Diagnosis Date Noted  . NSVD (normal spontaneous vaginal delivery) 12/31/2017  . Supervision of other normal pregnancy, antepartum 12/28/2017  . Previous cesarean delivery affecting pregnancy, antepartum 12/20/2017     Discharge diagnosis: Term Pregnancy Delivered and VBAC                                                                                                Post partum procedures:None  Augmentation: Pitocin  Complications: None  Hospital course:  Onset of Labor With Vaginal Delivery     22 y.o. yo G2P1001 at 4363w6d was admitted in Active Labor on 12/31/2017. Patient had an uncomplicated labor course as follows:  Membrane Rupture Time/Date: 12:00 PM ,12/31/2017   Intrapartum Procedures: Episiotomy: None [1]                                         Lacerations:  Labial [10]  Patient had a delivery of a Viable infant. 12/31/2017  Information for the patient's newborn:  Larence PenningHerrera Ojeda, Sahara Scarlett [[id number]][[id number]]  Delivery Method: Vag-Spont    Pateint had an uncomplicated postpartum course.  She is ambulating, tolerating a regular diet, passing flatus, and urinating well. Patient is discharged home in stable condition on 01/03/18.   Physical exam  Vitals:   12/31/17 2130 01/01/18 0534 01/01/18 1908 01/02/18 0613  BP: 122/71 104/71 105/65 116/80  Pulse: (!) 103 79 94 86  Resp: 20 18 18 18   Temp: 99.4 F (37.4 C) 98.1 F (36.7 C) 99 F (37.2 C) 98.2 F (36.8 C)  TempSrc: Oral Oral Oral Oral  SpO2: 100%     Weight:      Height:       General: alert, cooperative and no  distress Lochia: appropriate Uterine Fundus: firm Incision: N/A DVT Evaluation: No evidence of DVT seen on physical exam. Labs: Lab Results  Component Value Date   WBC 10.6 (H) 12/31/2017   HGB 10.1 (L) 12/31/2017   HCT 31.2 (L) 12/31/2017   MCV 84.6 12/31/2017   PLT 192 12/31/2017   No flowsheet data found.  Discharge instruction: per After Visit Summary and "Baby and Me Booklet".  After visit meds:  Allergies as of 01/02/2018      Reactions   Tuna [fish Allergy] Swelling   Swollen skin      Medication List    TAKE these medications   ibuprofen 600 MG tablet Commonly known as:  ADVIL,MOTRIN Take 1 tablet (600 mg total) by mouth every 6 (six) hours.   senna-docusate 8.6-50 MG tablet Commonly known as:  Senokot-S Take 2 tablets by mouth at bedtime as needed for mild  constipation.       Diet: routine diet  Activity: Advance as tolerated. Pelvic rest for 6 weeks.   Outpatient follow up:6 weeks Follow up Appt:No future appointments. Follow up Visit: Follow-up Information    Center for [facility]. Schedule an appointment as soon as possible for a visit in 2 week(s).   Specialty:  Obstetrics and Gynecology Why:  wants to be seen in 2 weeks as she will be traveling after that Contact information: [address] [place] [address] [phone]          Postpartum contraception: IUD  Newborn Data: Live born female  Birth Weight: 8 lb 0.9 oz (3655 g) APGAR: 8, 10  Newborn Delivery   Birth date/time:  12/31/2017 14:36:00 Delivery type:  VBAC, Spontaneous     Baby Feeding: Breast Disposition:home with mother   01/02/2018 Caryl Ada, DO

## 2018-01-02 NOTE — Progress Notes (Signed)
All discharge teaching completed with the patient. All printed discharge instructions given and explained to the patient. Patient verbalizes an understanding of all instructions given. No questions or concerns voiced at this time.  

## 2018-01-02 NOTE — Lactation Note (Signed)
This note was copied from a baby's chart. Lactation Consultation Note  Patient Name: Joanne Hooper MWUXL'KToday's Date: 01/02/2018 Reason for consult: Follow-up assessment  45 hours old early term female who is still being exclusively BF by her mother, baby is at 5% weight loss. Mom and baby are going home today. Reviewed discharge instructions. Engorgement prevention and treatment was also discussed. Encouraged mom to keep feeding baby STS 8-12 times/24 hours or sooner if feeding cues are present. Mom will keep giving baby an opportunity to feed placing her STS at the breast every 3 hours if baby is not cueing. Mom aware of LC OP services and will contact if needed.  Interventions Interventions: Breast feeding basics reviewed;Hand pump  Lactation Tools Discussed/Used Tools: Pump Breast pump type: Manual Pump Review: Setup, frequency, and cleaning;Milk Storage Initiated by:: RN Date initiated:: 01/02/18   Consult Status Consult Status: Complete    [name] 01/02/2018, 12:33 PM

## 2018-01-02 NOTE — Clinical Social Work Maternal (Signed)
CLINICAL SOCIAL WORK MATERNAL/CHILD NOTE  Patient Details  Name: Joanne Hooper MRN: [id number] Date of Birth: Aug 22, 1996  Date:  01/02/2018  Clinical Social Worker Initiating Note:  Madilyn Fireman, MSW, LCSW-A Date/Time: Initiated:  01/02/18/1300     Child's Name:  Armenia Herrera-Ojeda   Biological Parents:  Mother, Father   Need for Interpreter:  None   Reason for Referral:  Late or No Prenatal Care    Address:  [place] [address]    Phone number:  [phone] (home)     Additional phone number:   Household Members/Support Persons (HM/SP):   Household Member/Support Person 1   HM/SP Name Relationship DOB or Age  HM/SP -1 [place]    HM/SP -2        HM/SP -3        HM/SP -4        HM/SP -5        HM/SP -6        HM/SP -7        HM/SP -8          Natural Supports (not living in the home):  Extended Family, Friends, Immediate Family   Professional Supports:     Employment: Unemployed   Type of Work:     Education:  Programmer, systems   Homebound arranged:    Museum/gallery curator Resources:      Other Resources:  [facility]   Cultural/Religious Considerations Which May Impact Care: Patient is currently in [place] on "U-Visa"  Strengths:  Ability to meet basic needs , Home prepared for child , Pediatrician chosen   Psychotropic Medications:         Pediatrician:    [place] area  Pediatrician List:   [facility] [place]      Pediatrician Fax Number:    Risk Factors/Current Problems:      Cognitive State:  Alert , Able to Concentrate    Mood/Affect:  Calm , Comfortable    CSW Assessment: CSW met with patient, newborn, and friend of MOB at bedside to discuss limited prenatal care. Patient stated that she received prenatal care in [place] but did not bring her records. Patient stated that she came to  [place] on 12/08/17 on a "U-Visa," and that she is in the process of obtaining her dual citizenship in [place] and [place]. Patient has a 82 month old named Earnestine Mealing and a husband, Marisa Sprinkles who are still in [place]. Patient desires to return to [place] after the infant is about one month old to let her meet the family, but then will return to [place] to continue working on dual citizenship. CSW informed patient of hospital drug screening policy for urine and cord. Patient UDS was negative upon admission. Patient stated she did not have any concerns for the results. Patient stated she has a pack and play for infant to sleep in, SIDS reduction techniques discussed. Patient stated she has a new car seat for infant, safe transportation discussed. Patient will attend [facility] for pediatric care. Patient reports having good family and friend support. CSW educated patient on how to obtain [facility] services. Patient did not have further questions for CSW, CSW encouraged patient to reach out for assistance if needs arise.  CSW Plan/Description:  Sudden Infant Death Syndrome (SIDS) Education, Perinatal Mood and Anxiety  Disorder (PMADs) Education, [place], CSW Will Continue to Monitor Umbilical Cord Tissue Drug Screen Results and Make Report if Elsie Ra 01/02/2018, 1:02 PM

## 2018-01-02 NOTE — Discharge Instructions (Signed)

## 2018-01-05 ENCOUNTER — Encounter: Payer: Self-pay | Admitting: Family Medicine

## 2018-02-15 ENCOUNTER — Ambulatory Visit: Payer: Self-pay | Admitting: Obstetrics and Gynecology

## 2018-02-25 ENCOUNTER — Encounter: Payer: Self-pay | Admitting: *Deleted
